# Patient Record
Sex: Female | Born: 1965 | Race: White | Hispanic: No | Smoking: Former smoker
Health system: Southern US, Community
[De-identification: ages and names within clinical notes are randomized; demographics above are authoritative.]

## PROBLEM LIST (undated history)

## (undated) HISTORY — PX: PARTIAL HYSTERECTOMY: SHX80

## (undated) HISTORY — PX: CARPAL TUNNEL RELEASE: SHX101

## (undated) HISTORY — PX: HERNIA REPAIR: SHX51

## (undated) HISTORY — PX: TONSILLECTOMY: SUR1361

## (undated) HISTORY — PX: CHOLECYSTECTOMY: SHX55

---

## 1999-05-22 ENCOUNTER — Other Ambulatory Visit: Admission: RE | Admit: 1999-05-22 | Discharge: 1999-05-22 | Payer: Self-pay | Admitting: Family Medicine

## 2001-07-27 ENCOUNTER — Other Ambulatory Visit: Admission: RE | Admit: 2001-07-27 | Discharge: 2001-07-27 | Payer: Self-pay | Admitting: Family Medicine

## 2001-08-23 ENCOUNTER — Ambulatory Visit (HOSPITAL_COMMUNITY): Admission: RE | Admit: 2001-08-23 | Discharge: 2001-08-23 | Payer: Self-pay | Admitting: Unknown Physician Specialty

## 2001-08-23 ENCOUNTER — Encounter: Payer: Self-pay | Admitting: Unknown Physician Specialty

## 2002-09-26 ENCOUNTER — Other Ambulatory Visit: Admission: RE | Admit: 2002-09-26 | Discharge: 2002-09-26 | Payer: Self-pay | Admitting: Family Medicine

## 2003-03-21 ENCOUNTER — Observation Stay (HOSPITAL_COMMUNITY): Admission: AD | Admit: 2003-03-21 | Discharge: 2003-03-22 | Payer: Self-pay | Admitting: Obstetrics and Gynecology

## 2003-03-21 ENCOUNTER — Encounter (INDEPENDENT_AMBULATORY_CARE_PROVIDER_SITE_OTHER): Payer: Self-pay

## 2004-06-09 ENCOUNTER — Ambulatory Visit: Payer: Self-pay | Admitting: Family Medicine

## 2004-08-10 ENCOUNTER — Ambulatory Visit: Payer: Self-pay | Admitting: Family Medicine

## 2007-07-05 ENCOUNTER — Encounter: Admission: RE | Admit: 2007-07-05 | Discharge: 2007-07-05 | Payer: Self-pay | Admitting: Family Medicine

## 2010-12-04 NOTE — Op Note (Signed)
NAME:  Denise Black, Denise Black                           ACCOUNT NO.:  [id number]   MEDICAL RECORD NO.:  [id number]                   PATIENT TYPE:  OBV   LOCATION:  9309                                 FACILITY:  [facility]   PHYSICIAN:  Dineen Kid. Rana Snare, M.D.                 DATE OF BIRTH:  23-Jul-1965   DATE OF PROCEDURE:  03/21/2003  DATE OF DISCHARGE:                                 OPERATIVE REPORT   PREOPERATIVE DIAGNOSES:  1. Menometrorrhagia.  2. Dysmenorrhea.  3. Pelvic pain.  4. Dyspareunia.   POSTOPERATIVE DIAGNOSES:  1. Menometrorrhagia.  2. Dysmenorrhea.  3. Pelvic pain.  4. Dyspareunia.  5. Pelvic adhesions.   PROCEDURE:  Laparoscopically-assisted vaginal hysterectomy and lysis of  adhesions.   SURGEON:  Dineen Kid. Rana Snare, M.D.   ASSISTANT:  Raynald Kemp, M.D.   ANESTHESIA:  General endotracheal.   INDICATIONS:  Denise Black is a 45 year old G2, P2, referred for evaluation of  abnormal uterine bleeding over the last several years.  She has been on  dozens of different types of birth control pills and has continued to bleed  through these.  She also has dyspareunia and dysmenorrhea, which  have not  been responsive to anti-inflammatory medications.  Currently taking Vicodin  for pain.  She underwent a sonohysterogram, which shows a thickened  myometrium throughout the uterus, which is suspicious for adenomyosis.  No  intracavitary masses are noted on sonohysterogram.  She desires definitive  surgical treatment and requests surgical treatment and requests a  hysterectomy and presents for laparoscopically-assisted vaginal  hysterectomy.  Risks and benefits were discussed at length, and informed  consent was obtained.  See history and physical for further details.   FINDINGS AT TIME OF SURGERY:  A normal-appearing uterus, slightly boggy in  appearance.  Normal-appearing ovaries, cul-de-sac, bladder.  There were  adhesions from the omentum to the bowel and to the anterior abdominal  wall  from previous gallbladder surgery in the right lower quadrant.  Otherwise  normal-appearing liver.   DESCRIPTION OF PROCEDURE:  After adequate analgesia, the patient was placed  in the dorsal lithotomy position.  She was sterilely prepped and draped.  The bladder was sterilely drained.  A Graves speculum was placed and a Hulka  tenaculum was placed in the anterior lip of the cervix.  A 1 cm  infraumbilical skin incision was made, a Veress needle was insufflated, the  abdomen was insufflated to dullness with percussion.  An 11 mm trocar was  inserted and the laparoscope was inserted, and the above findings were  noted.  A 5 mm trocar was inserted to the left of midline two fingerbreadths  above the pubic symphysis.  The Gyrus tripolar ligature was used to  cauterize and ligate the omentum and bowel adhered to the anterior abdominal  wall, care taken to avoid the lumen of the bowel and achieve good  hemostasis.  The left utero-ovarian ligament was identified and ligated and  dissected across the round ligament through the utero-ovarian ligaments,  down across the remnant.  This was done similarly on the right side, across  the fallopian tube, utero-ovarian ligament, down across the round ligament.  The bladder was elevated, a small window in the vesicouterine fold was made.  The abdomen was then desufflated, the legs repositioned.  A weighted  speculum is placed, Jacobs tenaculum placed on the posterior cervix.  A  posterior colpotomy was performed.  The cervix was circumscribed with Bovie  cautery.  A LigaSure instrument was used to clamp and ligate both  uterosacral ligaments, dissected with Mayo scissors, ligated across the  cardinal ligaments and the bladder pillars in similar fashion.  The bladder  was dissected off the anterior surface of the cervix.  Anterior peritoneum  was entered and placed behind the Deaver retractor.  Successive bites across  the inferior portion of the  broad ligament were performed with the LigaSure  with good hemostasis achieved.  The uterus was then delivered into the  introitus and removed.  The uterosacral ligaments were then ligated with  sutures of 0 Monocryl in a figure-of-eight fashion.  The posterior  peritoneum was closed in a pursestring fashion and the posterior vaginal  mucosa was closed with figure-of-eights of 0 Monocryl in a vertical fashion.  The anterior vaginal mucosa was closed in a similar fashion using figure-of-  eights of 0 Monocryl with good approximation and good hemostasis achieved.  A Foley catheter was placed with return of clear yellow urine.  The abdomen  was reinsufflated.  The legs were repositioned, and re-examination of the  pelvis and cul-de-sac and area from the lysis of adhesions revealed good  hemostasis with the exception of a couple of small peritoneal edges, which  were cauterized with the Gyrus ligator.  After hemostasis had been assured,  the abdomen was desufflated, the trocar removed.  The infraumbilical skin  incision was closed with a 0 Vicryl in the fascia, interrupted suture, and a  3-0 Vicryl Rapide subcuticular stitch.  The 5 mm site was closed with a 3-0  Vicryl Rapide subcuticular stitch.  The incisions were injected with 0.25%  Marcaine.  The patient tolerated the procedure well, was taken to the  recovery room in satisfactory condition.  Sponge and instrument count was  normal x3.  Estimated blood loss from the procedure was 200 mL.                                               Dineen Kid Rana Snare, M.D.    DCL/MEDQ  D:  03/21/2003  T:  03/21/2003  Job:  [id number]

## 2010-12-04 NOTE — Discharge Summary (Signed)
   NAME:  Denise Black, HAUSER                           ACCOUNT NO.:  [id number]   MEDICAL RECORD NO.:  [id number]                   PATIENT TYPE:  OBV   LOCATION:  9309                                 FACILITY:  [facility]   PHYSICIAN:  Dineen Kid. Rana Snare, M.D.                 DATE OF BIRTH:  1965/09/01   DATE OF ADMISSION:  03/21/2003  DATE OF DISCHARGE:  03/22/2003                                 DISCHARGE SUMMARY   HISTORY OF PRESENT ILLNESS:  Ms. Shugrue is a 45 year old, G2, P2, referred  for evaluation of abnormal bleeding.  Over the last several years, has been  on and off dosing of  birth control pills without success.  She is also  complaining of dyspareunia, dysmenorrhea which has not been responsive to  conservative management which has included oral contraceptive agents,  antiinflammatory medications and narcotics.  She presents today for  definitive surgical treatment of planned laparoscopically assisted vaginal  hysterectomy.   HOSPITAL COURSE:  The patient was admitted to same day of surgery, underwent  a laparoscopically assisted vaginal hysterectomy and lysis of adhesions.  The surgery was uncomplicated with an estimated blood loss of 200 mL.  The  findings at the time of surgery were omental adhesions in the right lower  quadrant from a previous hernia repair, normal appearing liver and ovaries.  Uterus was slightly boggy consistent with adenomyosis.  Her postoperative  course was unremarkable with good return of bowel function and ambulation.   By postoperative day #1, she was tolerating a regular diet, ambulating  without difficulty.  Postoperative hemoglobin 11.0.  Abdomen was soft and  nontender with normoactive bowel sounds.  The incisions were clean, dry and  intact.  The patient was discharged home.   DISPOSITION:  The patient will be discharged home with followup in the  office in 1-2 weeks.  She was sent home with a prescription for Dilaudid.  Told to return for increased  pain, fever or bleeding.                                               Dineen Kid Rana Snare, M.D.    DCL/MEDQ  D:  03/22/2003  T:  03/23/2003  Job:  [id number]

## 2010-12-04 NOTE — H&P (Signed)
NAME:  Denise Black, Denise Black                           ACCOUNT NO.:  [id number]   MEDICAL RECORD NO.:  [id number]                   PATIENT TYPE:  OBV   LOCATION:  NA                                   FACILITY:  [facility]   PHYSICIAN:  Dineen Kid. Rana Snare, M.D.                 DATE OF BIRTH:  1966/06/21   DATE OF ADMISSION:  DATE OF DISCHARGE:                                HISTORY & PHYSICAL   SCHEDULED DATE OF ADMISSION:  March 21, 2003   HISTORY OF PRESENT ILLNESS:  Ms. Carby is a 45 year old G2 P2 who has been  referred to me for evaluation of abnormal bleeding over the last several  years.  She has been dozens of different birth control pills, continues to  bleed through these.  She also has dyspareunia and dysmenorrhea which has  not been responsive to antiinflammatory medications.  Currently taking  Vicodin for this pain.  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  She underwent a  sonohysterogram which shows a thickened myometrium throughout the uterus  which is suspicious for adenomyosis.  She does have no intracavitary masses  noted on sonohysterogram and normal-appearing ovaries.  She desires  definitive surgical treatment and requests a hysterectomy, and presents for  a laparoscopy-assisted vaginal hysterectomy.   PAST MEDICAL HISTORY:  Significant for reflex dystrophy.   PAST SURGICAL HISTORY:  She has had her gallbladder removed in May 2004.   PAST OBSTETRICAL HISTORY:  She has had two vaginal deliveries.   ALLERGIES:  PENICILLIN.   MEDICATIONS:  She currently takes Flexeril and Vicodin.   PHYSICAL EXAMINATION:  VITAL SIGNS:  Blood pressure is 118/70.  HEART:  Regular rate and rhythm.  LUNGS:  Clear to auscultation bilaterally.  ABDOMEN:  Nondistended, nontender.  No masses are palpable.  PELVIC:  External genitalia has a maroon-colored plaque over the labia  majora which appears to be resolving dermatophyte infection.   No  lymphadenopathy is noted.  Cervix within normal limits.  The uterus is  anteverted, mobile, nontender.  No adnexal masses are palpable.   IMPRESSION AND PLAN:  Menometrorrhagia, dysmenorrhea, pelvic pain, and  dyspareunia.  Ultrasound and sonohysterogram consistent with adenomyosis  with no intracavitary masses.  The patient was offered different expectant  management such as continue antiinflammatory medications, different oral  contraceptive agents - pills or Depo-Provera.  She has tried all of these in  the past with very little success.  She desires more definitive surgery such  as hysterectomy and has no further childbearing desires.   PLAN:  Proceed with laparoscopy-assisted vaginal hysterectomy with  preservation of both ovaries.  Risks and benefits of the procedure were  discussed at length which include but not limited to:  Risk of infection,  bleeding;  damage to ureters, tubes, ovaries, bowel, bladder; risks associated with  blood  loss, blood transfusion risks, and risks associated with anesthesia;  also the possibility that this may not alleviate the pelvic pain or possibly  worsen the pelvic pain.  She does give her informed consent and wishes to  proceed.                                               Dineen Kid Rana Snare, M.D.    DCL/MEDQ  D:  03/19/2003  T:  03/19/2003  Job:  [id number]

## 2010-12-24 ENCOUNTER — Emergency Department (HOSPITAL_COMMUNITY): Payer: No Typology Code available for payment source

## 2010-12-24 ENCOUNTER — Observation Stay (HOSPITAL_COMMUNITY)
Admission: EM | Admit: 2010-12-24 | Discharge: 2010-12-25 | DRG: 512 | Disposition: A | Discharge status: Home / Self Care | Payer: No Typology Code available for payment source | Attending: Orthopedic Surgery | Admitting: Orthopedic Surgery

## 2010-12-24 DIAGNOSIS — G5600 Carpal tunnel syndrome, unspecified upper limb: Secondary | ICD-10-CM | POA: Insufficient documentation

## 2010-12-24 DIAGNOSIS — Z01818 Encounter for other preprocedural examination: Secondary | ICD-10-CM | POA: Insufficient documentation

## 2010-12-24 DIAGNOSIS — Z01810 Encounter for preprocedural cardiovascular examination: Secondary | ICD-10-CM | POA: Insufficient documentation

## 2010-12-24 DIAGNOSIS — S4490XA Injury of unspecified nerve at shoulder and upper arm level, unspecified arm, initial encounter: Secondary | ICD-10-CM | POA: Insufficient documentation

## 2010-12-24 DIAGNOSIS — S52599A Other fractures of lower end of unspecified radius, initial encounter for closed fracture: Principal | ICD-10-CM | POA: Insufficient documentation

## 2010-12-24 DIAGNOSIS — Z01812 Encounter for preprocedural laboratory examination: Secondary | ICD-10-CM | POA: Insufficient documentation

## 2010-12-24 LAB — CBC
Platelets: 290 10*3/uL (ref 150–400)
RBC: 4 MIL/uL (ref 3.87–5.11)
WBC: 10.4 10*3/uL (ref 4.0–10.5)

## 2010-12-24 LAB — COMPREHENSIVE METABOLIC PANEL
AST: 16 U/L (ref 0–37)
Albumin: 3.4 g/dL — ABNORMAL LOW (ref 3.5–5.2)
Alkaline Phosphatase: 59 U/L (ref 39–117)
Chloride: 105 mEq/L (ref 96–112)
Creatinine, Ser: 0.61 mg/dL (ref 0.4–1.2)
GFR calc Af Amer: >60 mL/min (ref >60)
Potassium: 3.6 mEq/L (ref 3.5–5.1)
Total Bilirubin: 0.1 mg/dL — ABNORMAL LOW (ref 0.3–1.2)
Total Protein: 6.9 g/dL (ref 6.0–8.3)

## 2010-12-24 LAB — DIFFERENTIAL
Basophils Relative: 0 % (ref 0–1)
Eosinophils Absolute: 0.1 10*3/uL (ref 0.0–0.7)
Neutrophils Relative %: 64 % (ref 43–77)

## 2011-01-04 NOTE — Op Note (Signed)
[name], [name] NO.:  [id number]  MEDICAL RECORD NO.:  [id number]  LOCATION:  5016                         FACILITY:  [facility]  PHYSICIAN:  Dionne Ano. Ryane Canavan, M.D.DATE OF BIRTH:  [birth year]  DATE OF PROCEDURE: DATE OF DISCHARGE:  12/25/2010                              OPERATIVE REPORT   PREOPERATIVE DIAGNOSES:  Left comminuted complex greater than 5 part distal radius fracture with displacement and history of carpal tunnel syndrome with intermittent neurapraxia.  POSTOPERATIVE DIAGNOSES:  Left comminuted complex greater than 5 part distal radius fracture with displacement and history of carpal tunnel syndrome with intermittent neurapraxia.  PROCEDURE: 1. Open reduction and internal fixation with Synthes distal radius     plate, left distal radius fracture which was comminuted complex     greater than 5 part. 2. Left carpal tunnel release. 3. Stress radiography.  SURGEON:  Dionne Ano. Amanda Pea, MD  ASSISTANT:  Karie Chimera, PA-C  COMPLICATIONS:  None.  ANESTHESIA:  General with preoperative block given her history of complex regional pain symptoms in the past.  ESTIMATED BLOOD LOSS:  Minimal.  DRAINS:  One.  INDICATIONS FOR PROCEDURE:  A 45 year old female presents with the above- mentioned diagnosis.  This patient has a comminuted complex fracture with displacement history of carpal tunnel syndrome and history of complex regional pain syndrome.  We have recommended ORIF.  She understands risks and benefits including resurgence and recurrence of her dystrophy, bleeding, infection, anesthetic risk, damage to normal structures and failure of surgery to accomplish its intended goals of relieving symptoms and restoring function.  With this in mind, she desires to proceed.  All questions have been encouraged and answered preoperatively.  We specifically asked for block.  We are going to make sure that she does not have dystrophic symptoms and get  her out of __________ early in terms of motion and therapy.  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  I have discussed with the patient that certainly given the volar displacement, she is not a candidate for closed treatment.  She has a volar Barton's fracture pattern with associated ulnar styloid fracture.  Given the history of carpal tunnel syndrome and the issues of possible neural dystrophic focus and other issues, we have elected to proceed with carpal tunnel release as well.  OPERATIVE PROCEDURE:  The patient was seen by myself and anesthesia, taken to operative suite, underwent smooth induction of general anesthesia.  Preoperatively, a block was placed at our request.  Time- out was called, consent verified, and all questions were encouraged and answered including the risk benefit profile.  Once in the operative room, she was prepped and draped in usual sterile fashion with Betadine scrub and paint about the left upper extremity.  Once this was done, volar radial incision was made with anterior 50-mm tourniquet control. FCR tendon sheath was incised palmarly and dorsally.  Radial artery was protected.  Carpal canal contents were swept ulnarly and following this the patient had access to the pronator.  The pronator  was incised. Following this fracture was accessed.  Once this done, Synthes distal radius plate was contoured, prebent, and placed under standard AO technique.  I was able to achieve adequate position.  I was able to recreate the volar height and inclination, radial height and radial inclination.  I was pleased with the findings.  The patient reduced nicely.  Following this, I then performed radial styloid ORIF with combination screws and 2 pins which were buried.  Pins will be removed at a later date once  fracture healing is stabilized.  Once the screw pin and plate construct was placed, live fluoro was performed and she had excellent stability noted.  At this time, we closed the pronator with Vicryl, placed a TLS drain and then made a 1-1/2 to 2 cm incision at the distal edge of transcarpal ligament.  Dissection was carried down and the patient then underwent release of the palmar fascia followed by release of the transverse carpal ligament throughout its extent.  Fat pad egressed nicely distally.  Distal proximal dissection was carried out so that we could properly release the proximal leaflet which was done without complicating feature.  Median nerve sat tension free.  It was hyperemic, intact, and without iatrogenic injury.  Thus carpal tunnel release and ORIF of the distal radius was accomplished.  The distal radioulnar joint was stable.  There was no locking, popping, catching, and all looked quite well.  She was irrigated copiously and closed with Prolene in the skin edge about the carpal tunnel release site.  Vicryl followed by Prolene in the skin edge about the volar radial incision for the radius fracture was used.  The patient tolerated this all quite well without difficulty.  Thumb spica clamshell splint was placed without difficulty making sure the finger showed excellent refill and the compartments were soft.  She was transferred to the recovery room in stable condition.  We will plan for pain management, ice, elevation range of motion, antibiotic per protocol.  We are going to monitor condition closely while she is in the hospital.  She will be admitted for IV antibiotics in the overnight stay of course. Of course with her history of complex regional pain syndrome, I want to keep an extra special eye on her to make sure there are no complicating issues that arise.  These notes have been discussed and all questions encouraged and answered.     Dionne Ano. Amanda Pea,  M.D.     [facility]  D:  12/25/2010  T:  12/25/2010  Job:  [id number]  Electronically Signed by Dominica Severin M.D. on 01/04/2011 06:33:40 AM

## 2011-02-10 ENCOUNTER — Ambulatory Visit: Payer: No Typology Code available for payment source | Attending: Orthopedic Surgery | Admitting: Occupational Therapy

## 2011-02-10 DIAGNOSIS — M25549 Pain in joints of unspecified hand: Secondary | ICD-10-CM | POA: Insufficient documentation

## 2011-02-10 DIAGNOSIS — M6281 Muscle weakness (generalized): Secondary | ICD-10-CM | POA: Insufficient documentation

## 2011-02-10 DIAGNOSIS — IMO0001 Reserved for inherently not codable concepts without codable children: Secondary | ICD-10-CM | POA: Insufficient documentation

## 2011-02-10 DIAGNOSIS — M25649 Stiffness of unspecified hand, not elsewhere classified: Secondary | ICD-10-CM | POA: Insufficient documentation

## 2011-02-15 ENCOUNTER — Ambulatory Visit: Payer: No Typology Code available for payment source | Admitting: Occupational Therapy

## 2011-02-17 ENCOUNTER — Ambulatory Visit: Payer: No Typology Code available for payment source | Attending: Orthopedic Surgery | Admitting: Occupational Therapy

## 2011-02-17 DIAGNOSIS — M25649 Stiffness of unspecified hand, not elsewhere classified: Secondary | ICD-10-CM | POA: Insufficient documentation

## 2011-02-17 DIAGNOSIS — M25549 Pain in joints of unspecified hand: Secondary | ICD-10-CM | POA: Insufficient documentation

## 2011-02-17 DIAGNOSIS — M6281 Muscle weakness (generalized): Secondary | ICD-10-CM | POA: Insufficient documentation

## 2011-02-17 DIAGNOSIS — IMO0001 Reserved for inherently not codable concepts without codable children: Secondary | ICD-10-CM | POA: Insufficient documentation

## 2011-02-23 ENCOUNTER — Ambulatory Visit: Payer: No Typology Code available for payment source | Admitting: Occupational Therapy

## 2011-02-26 ENCOUNTER — Ambulatory Visit: Payer: No Typology Code available for payment source | Admitting: Occupational Therapy

## 2011-03-02 ENCOUNTER — Ambulatory Visit: Payer: No Typology Code available for payment source | Admitting: Occupational Therapy

## 2011-03-04 ENCOUNTER — Encounter: Payer: No Typology Code available for payment source | Admitting: Occupational Therapy

## 2011-03-09 ENCOUNTER — Ambulatory Visit: Payer: No Typology Code available for payment source | Admitting: Occupational Therapy

## 2011-03-11 ENCOUNTER — Ambulatory Visit: Payer: No Typology Code available for payment source | Admitting: Occupational Therapy

## 2011-03-16 ENCOUNTER — Encounter: Payer: No Typology Code available for payment source | Admitting: Occupational Therapy

## 2011-03-19 ENCOUNTER — Ambulatory Visit: Payer: No Typology Code available for payment source | Admitting: Occupational Therapy

## 2011-03-23 ENCOUNTER — Ambulatory Visit: Payer: No Typology Code available for payment source | Attending: Orthopedic Surgery | Admitting: Occupational Therapy

## 2011-03-23 DIAGNOSIS — M25649 Stiffness of unspecified hand, not elsewhere classified: Secondary | ICD-10-CM | POA: Insufficient documentation

## 2011-03-23 DIAGNOSIS — IMO0001 Reserved for inherently not codable concepts without codable children: Secondary | ICD-10-CM | POA: Insufficient documentation

## 2011-03-23 DIAGNOSIS — M25549 Pain in joints of unspecified hand: Secondary | ICD-10-CM | POA: Insufficient documentation

## 2011-03-23 DIAGNOSIS — M6281 Muscle weakness (generalized): Secondary | ICD-10-CM | POA: Insufficient documentation

## 2011-03-25 ENCOUNTER — Ambulatory Visit: Payer: No Typology Code available for payment source | Admitting: Occupational Therapy

## 2011-03-30 ENCOUNTER — Ambulatory Visit: Payer: No Typology Code available for payment source | Admitting: Occupational Therapy

## 2011-04-01 ENCOUNTER — Ambulatory Visit: Payer: No Typology Code available for payment source | Admitting: Occupational Therapy

## 2011-04-06 ENCOUNTER — Ambulatory Visit: Payer: No Typology Code available for payment source | Admitting: Occupational Therapy

## 2011-04-09 ENCOUNTER — Ambulatory Visit: Payer: No Typology Code available for payment source | Admitting: Occupational Therapy

## 2011-04-13 ENCOUNTER — Ambulatory Visit: Payer: No Typology Code available for payment source | Admitting: Occupational Therapy

## 2011-12-23 ENCOUNTER — Other Ambulatory Visit: Payer: Self-pay | Admitting: Obstetrics

## 2011-12-23 DIAGNOSIS — Z1231 Encounter for screening mammogram for malignant neoplasm of breast: Secondary | ICD-10-CM

## 2012-01-07 ENCOUNTER — Ambulatory Visit: Payer: No Typology Code available for payment source

## 2012-01-10 ENCOUNTER — Ambulatory Visit
Admission: RE | Admit: 2012-01-10 | Discharge: 2012-01-10 | Disposition: A | Discharge status: Home / Self Care | Payer: No Typology Code available for payment source | Source: Ambulatory Visit | Attending: Obstetrics | Admitting: Obstetrics

## 2012-01-10 DIAGNOSIS — Z1231 Encounter for screening mammogram for malignant neoplasm of breast: Secondary | ICD-10-CM

## 2012-11-01 IMAGING — MG MM DIGITAL SCREENING BILAT
4 series · 4 of 4 positions shown · non-contrast
Comparison: Previous exams

CLINICAL DATA: Screening.

DIGITAL SCREENING MAMMOGRAM WITH CAD

[R CC]
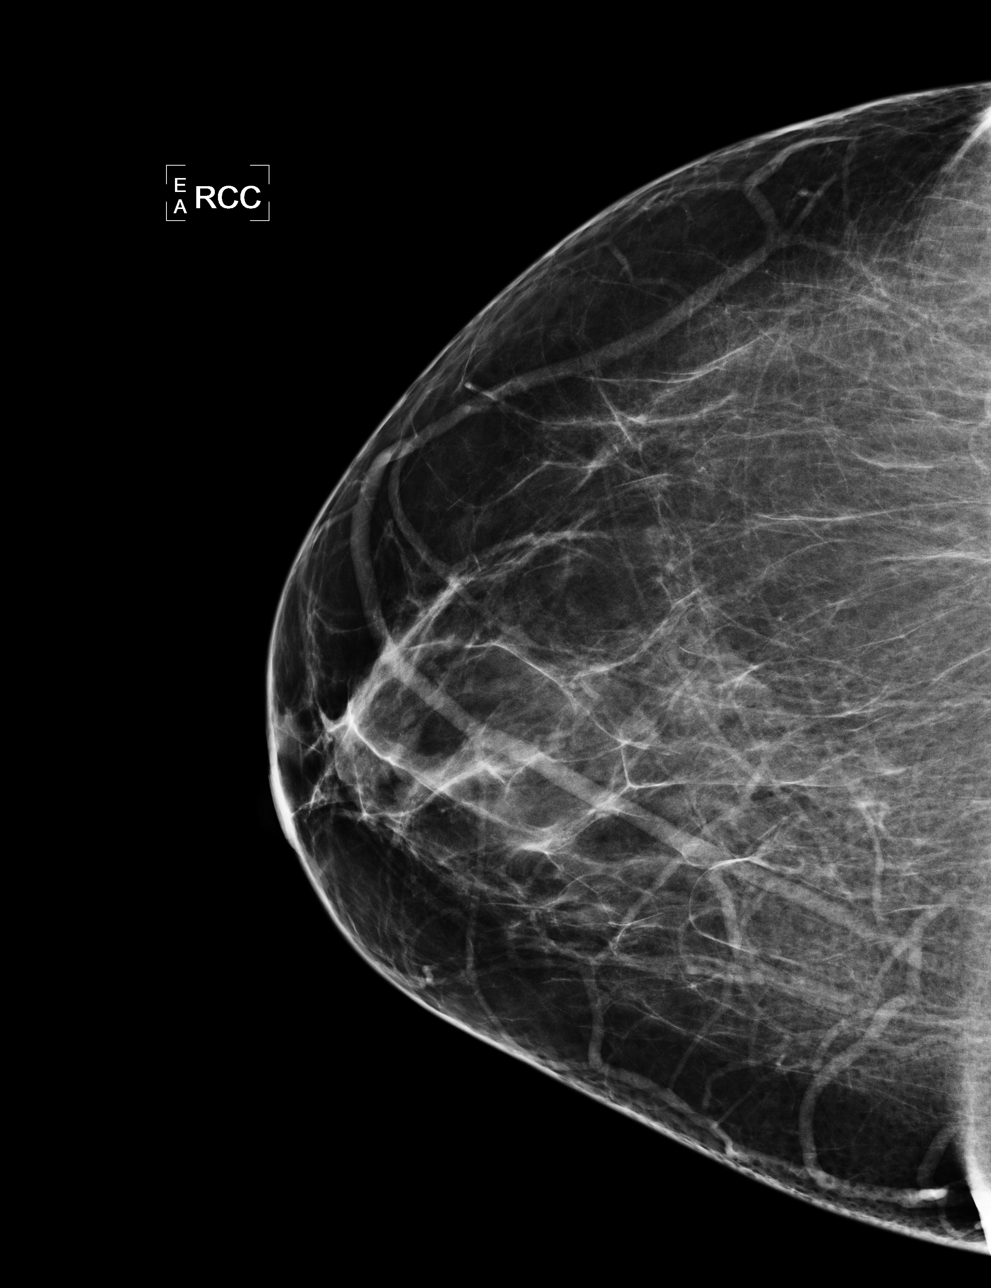

[L CC]
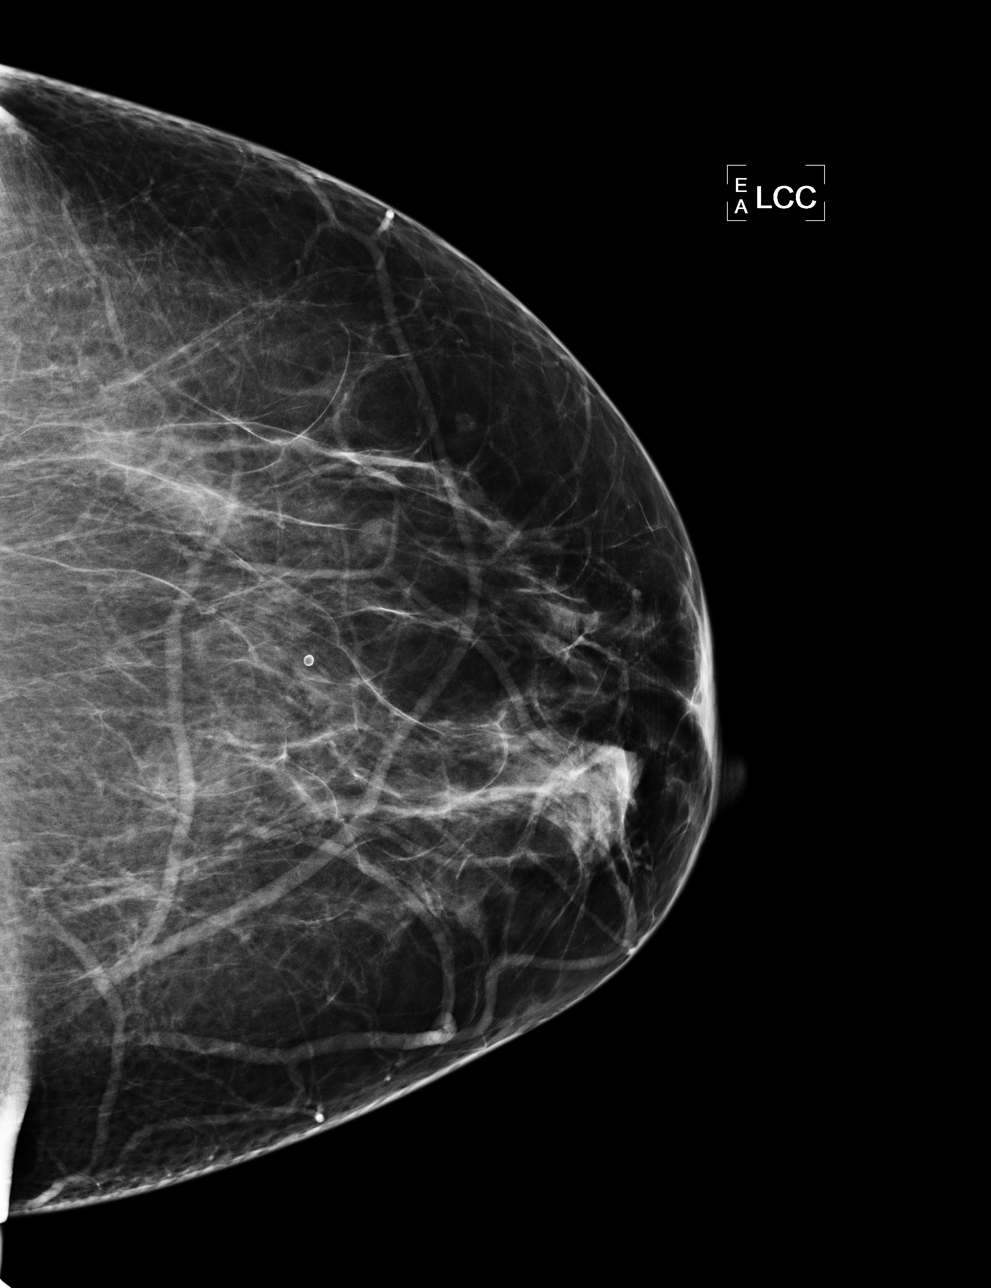

[L MLO]
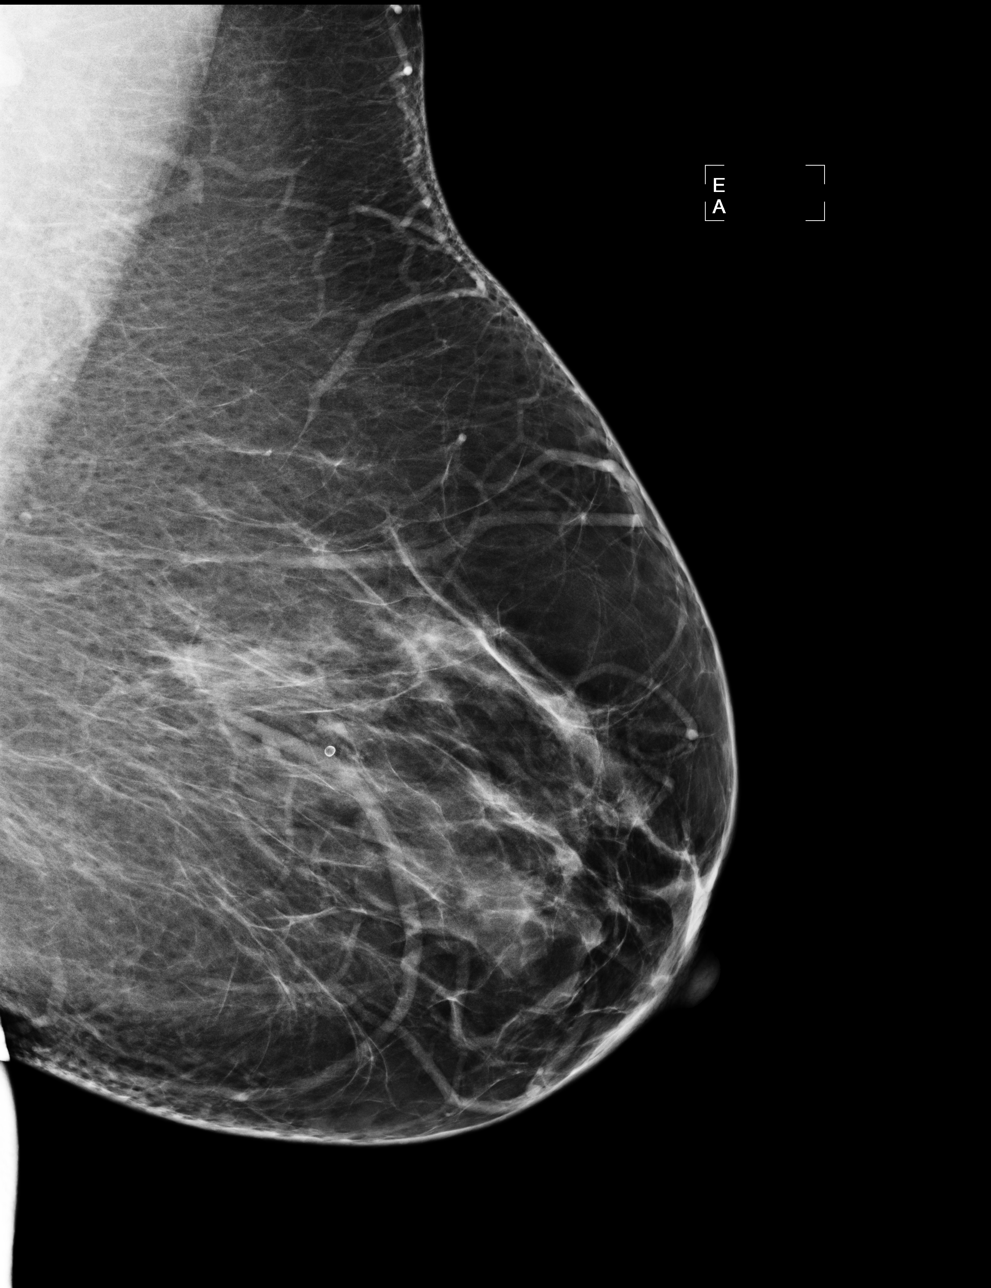

[R MLO]
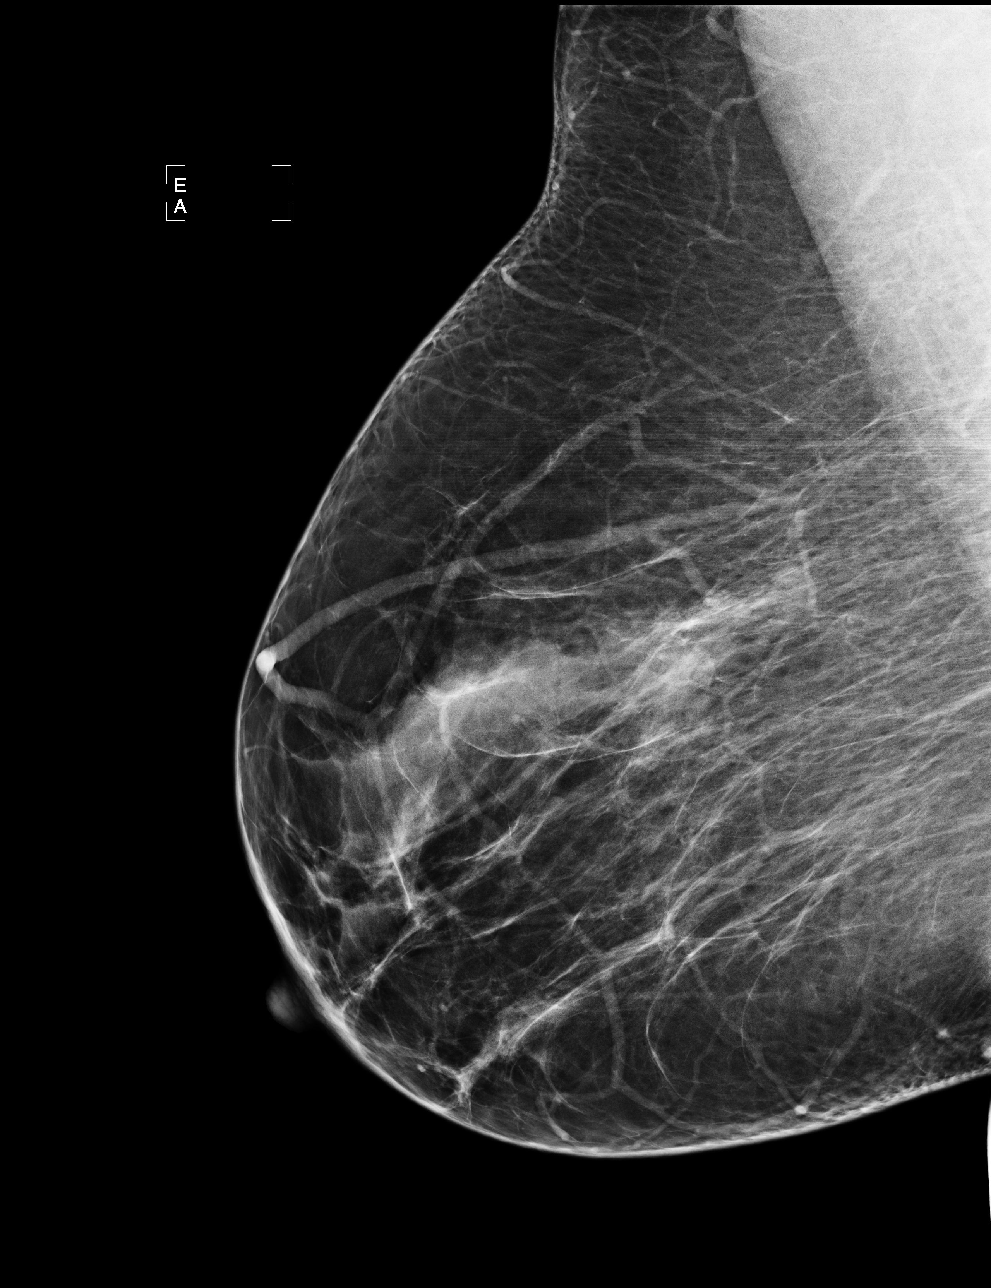

[4 of 4 positions shown; findings below may reference images not displayed]

FINDINGS: There are scattered fibroglandular densities. No
suspicious masses, architectural distortion, or calcifications are
present.

Images were processed with CAD.
IMPRESSION: No specific mammographic evidence of malignancy.

A result letter of this screening mammogram will be mailed directly
to the patient.

RECOMMENDATION:
Screening mammogram in one year. (Code:A7-G-7OZ)

BI-RADS CATEGORY 1:  Negative

## 2013-03-13 DIAGNOSIS — M533 Sacrococcygeal disorders, not elsewhere classified: Secondary | ICD-10-CM | POA: Insufficient documentation

## 2013-03-13 DIAGNOSIS — G542 Cervical root disorders, not elsewhere classified: Secondary | ICD-10-CM | POA: Insufficient documentation

## 2013-03-13 DIAGNOSIS — M545 Low back pain, unspecified: Secondary | ICD-10-CM | POA: Insufficient documentation

## 2013-03-13 HISTORY — DX: Sacrococcygeal disorders, not elsewhere classified: M53.3

## 2013-06-05 DIAGNOSIS — IMO0001 Reserved for inherently not codable concepts without codable children: Secondary | ICD-10-CM

## 2013-06-05 HISTORY — DX: Reserved for inherently not codable concepts without codable children: IMO0001

## 2013-08-23 DIAGNOSIS — M129 Arthropathy, unspecified: Secondary | ICD-10-CM | POA: Insufficient documentation

## 2013-11-13 DIAGNOSIS — M5416 Radiculopathy, lumbar region: Secondary | ICD-10-CM | POA: Insufficient documentation

## 2013-11-13 DIAGNOSIS — M25559 Pain in unspecified hip: Secondary | ICD-10-CM | POA: Insufficient documentation

## 2013-11-13 DIAGNOSIS — G2581 Restless legs syndrome: Secondary | ICD-10-CM

## 2013-11-13 DIAGNOSIS — M51379 Other intervertebral disc degeneration, lumbosacral region without mention of lumbar back pain or lower extremity pain: Secondary | ICD-10-CM

## 2013-11-13 DIAGNOSIS — M5137 Other intervertebral disc degeneration, lumbosacral region: Secondary | ICD-10-CM

## 2013-11-13 HISTORY — DX: Restless legs syndrome: G25.81

## 2013-11-13 HISTORY — DX: Other intervertebral disc degeneration, lumbosacral region: M51.37

## 2013-11-13 HISTORY — DX: Other intervertebral disc degeneration, lumbosacral region without mention of lumbar back pain or lower extremity pain: M51.379

## 2013-11-16 DIAGNOSIS — M25569 Pain in unspecified knee: Secondary | ICD-10-CM | POA: Insufficient documentation

## 2017-08-23 ENCOUNTER — Ambulatory Visit: Payer: 59 | Admitting: Podiatry

## 2017-08-23 ENCOUNTER — Encounter: Payer: Self-pay | Admitting: Podiatry

## 2017-08-23 ENCOUNTER — Ambulatory Visit (INDEPENDENT_AMBULATORY_CARE_PROVIDER_SITE_OTHER): Payer: 59

## 2017-08-23 VITALS — BP 105/67 | HR 84 | Ht 62.0 in | Wt 210.0 lb

## 2017-08-23 DIAGNOSIS — M659 Unspecified synovitis and tenosynovitis, unspecified site: Secondary | ICD-10-CM

## 2017-08-23 DIAGNOSIS — M722 Plantar fascial fibromatosis: Secondary | ICD-10-CM | POA: Diagnosis not present

## 2017-08-23 DIAGNOSIS — M79672 Pain in left foot: Secondary | ICD-10-CM

## 2017-08-23 DIAGNOSIS — M7662 Achilles tendinitis, left leg: Secondary | ICD-10-CM

## 2017-08-23 DIAGNOSIS — M216X9 Other acquired deformities of unspecified foot: Secondary | ICD-10-CM | POA: Diagnosis not present

## 2017-08-23 MED ORDER — MELOXICAM 15 MG PO TABS: 15.0000 mg | ORAL_TABLET | Freq: Every day | ORAL | 0 refills | Status: DC

## 2017-08-23 NOTE — Progress Notes (Signed)
  Subjective:  Patient ID: Denise Black, female    DOB: 1966-06-19,  MRN: [id number]  Chief Complaint  Patient presents with  . Foot Pain    left heel pain am tender pm extremely painful    52 y.o. female presents for heel pain. Reports severe pain to the left heel.  Worsening over the past 3 weeks.  Pain is worse at the end of the day.  Denies pain worse in the morning.  States that the right foot started to hurt as well.  No past medical history on file.   Current Outpatient Medications:  .  esomeprazole (NEXIUM) 20 MG capsule, Take by mouth., Disp: , Rfl:  .  meloxicam (MOBIC) 15 MG tablet, Take 1 tablet (15 mg total) by mouth daily., Disp: 30 tablet, Rfl: 0  Allergies  Allergen Reactions  . Iodinated Diagnostic Agents   . Penicillins Rash    ROS: All systems reviewed and negative except as noted in the HPI. Objective:   Vitals:   08/23/17 1440  BP: 105/67  Pulse: 84   General AA&O x3. Normal mood and affect.  Vascular Dorsalis pedis and posterior tibial pulses  present 2+ bilaterally  Capillary refill normal to all digits. Pedal hair growth normal.  Neurologic Epicritic sensation grossly present bilaterally.  Dermatologic No open lesions. Interspaces clear of maceration. Nails well groomed and normal in appearance.  Orthopedic: MMT 5/5 in dorsiflexion, plantarflexion, inversion, and eversion bilaterally. Tender to palpation at the calcaneal tuber left. No pain with calcaneal squeeze bilaterally. Ankle ROM diminished range of motion bilaterally. Silfverskiold Test: positive bilaterally.   Radiographs: Taken and reviewed. No acute fractures. No evidence of calcaneal stress fracture. Posterior and retrocalcaneal spurring noted.   Assessment & Plan:  Patient was evaluated and treated and all questions answered.  Plantar Fasciitis, left; Achilles Tendonitis, L - XR reviewed as above.  - Educated on icing and stretching. Instructions given.  - Injection delivered to  the plantar fascia as below. - Night splint dispensed. - Rx Meloxicam  Procedure: Injection Tendon/Ligament Location: Left plantar fascia at the glabrous junction; medial approach. Skin Prep: Alcohol. Injectate: 1 cc 0.5% marcaine plain, 1 cc dexamethasone phosphate, 0.5 cc kenalog 10. Disposition: Patient tolerated procedure well. Injection site dressed with a band-aid.  Return in about 3 weeks (around 09/13/2017) for Plantar fasciitis.

## 2017-08-23 NOTE — Patient Instructions (Signed)
Plantar Fasciitis (Heel Spur Syndrome) with Rehab The plantar fascia is a fibrous, ligament-like, soft-tissue structure that spans the bottom of the foot. Plantar fasciitis is a condition that causes pain in the foot due to inflammation of the tissue. SYMPTOMS   Pain and tenderness on the underneath side of the foot.  Pain that worsens with standing or walking. CAUSES  Plantar fasciitis is caused by irritation and injury to the plantar fascia on the underneath side of the foot. Common mechanisms of injury include:  Direct trauma to bottom of the foot.  Damage to a small nerve that runs under the foot where the main fascia attaches to the heel bone.  Stress placed on the plantar fascia due to bone spurs. RISK INCREASES WITH:   Activities that place stress on the plantar fascia (running, jumping, pivoting, or cutting).  Poor strength and flexibility.  Improperly fitted shoes.  Tight calf muscles.  Flat feet.  Failure to warm-up properly before activity.  Obesity. PREVENTION  Warm up and stretch properly before activity.  Allow for adequate recovery between workouts.  Maintain physical fitness:  Strength, flexibility, and endurance.  Cardiovascular fitness.  Maintain a health body weight.  Avoid stress on the plantar fascia.  Wear properly fitted shoes, including arch supports for individuals who have flat feet.  PROGNOSIS  If treated properly, then the symptoms of plantar fasciitis usually resolve without surgery. However, occasionally surgery is necessary.  RELATED COMPLICATIONS   Recurrent symptoms that may result in a chronic condition.  Problems of the lower back that are caused by compensating for the injury, such as limping.  Pain or weakness of the foot during push-off following surgery.  Chronic inflammation, scarring, and partial or complete fascia tear, occurring more often from repeated injections.  TREATMENT  Treatment initially involves the  use of ice and medication to help reduce pain and inflammation. The use of strengthening and stretching exercises may help reduce pain with activity, especially stretches of the Achilles tendon. These exercises may be performed at home or with a therapist. Your caregiver may recommend that you use heel cups of arch supports to help reduce stress on the plantar fascia. Occasionally, corticosteroid injections are given to reduce inflammation. If symptoms persist for greater than 6 months despite non-surgical (conservative), then surgery may be recommended.   MEDICATION   If pain medication is necessary, then nonsteroidal anti-inflammatory medications, such as aspirin and ibuprofen, or other minor pain relievers, such as acetaminophen, are often recommended.  Do not take pain medication within 7 days before surgery.  Prescription pain relievers may be given if deemed necessary by your caregiver. Use only as directed and only as much as you need.  Corticosteroid injections may be given by your caregiver. These injections should be reserved for the most serious cases, because they may only be given a certain number of times.  HEAT AND COLD  Cold treatment (icing) relieves pain and reduces inflammation. Cold treatment should be applied for 10 to 15 minutes every 2 to 3 hours for inflammation and pain and immediately after any activity that aggravates your symptoms. Use ice packs or massage the area with a piece of ice (ice massage).  Heat treatment may be used prior to performing the stretching and strengthening activities prescribed by your caregiver, physical therapist, or athletic trainer. Use a heat pack or soak the injury in warm water.  SEEK IMMEDIATE MEDICAL CARE IF:  Treatment seems to offer no benefit, or the condition worsens.  Any medications   produce adverse side effects.  EXERCISES- RANGE OF MOTION (ROM) AND STRETCHING EXERCISES - Plantar Fasciitis (Heel Spur Syndrome) These exercises  may help you when beginning to rehabilitate your injury. Your symptoms may resolve with or without further involvement from your physician, physical therapist or athletic trainer. While completing these exercises, remember:   Restoring tissue flexibility helps normal motion to return to the joints. This allows healthier, less painful movement and activity.  An effective stretch should be held for at least 30 seconds.  A stretch should never be painful. You should only feel a gentle lengthening or release in the stretched tissue.  RANGE OF MOTION - Toe Extension, Flexion  Sit with your right / left leg crossed over your opposite knee.  Grasp your toes and gently pull them back toward the top of your foot. You should feel a stretch on the bottom of your toes and/or foot.  Hold this stretch for 10 seconds.  Now, gently pull your toes toward the bottom of your foot. You should feel a stretch on the top of your toes and or foot.  Hold this stretch for 10 seconds. Repeat  times. Complete this stretch 3 times per day.   RANGE OF MOTION - Ankle Dorsiflexion, Active Assisted  Remove shoes and sit on a chair that is preferably not on a carpeted surface.  Place right / left foot under knee. Extend your opposite leg for support.  Keeping your heel down, slide your right / left foot back toward the chair until you feel a stretch at your ankle or calf. If you do not feel a stretch, slide your bottom forward to the edge of the chair, while still keeping your heel down.  Hold this stretch for 10 seconds. Repeat 3 times. Complete this stretch 2 times per day.   STRETCH  Gastroc, Standing  Place hands on wall.  Extend right / left leg, keeping the front knee somewhat bent.  Slightly point your toes inward on your back foot.  Keeping your right / left heel on the floor and your knee straight, shift your weight toward the wall, not allowing your back to arch.  You should feel a gentle stretch  in the right / left calf. Hold this position for 10 seconds. Repeat 3 times. Complete this stretch 2 times per day.  STRETCH  Soleus, Standing  Place hands on wall.  Extend right / left leg, keeping the other knee somewhat bent.  Slightly point your toes inward on your back foot.  Keep your right / left heel on the floor, bend your back knee, and slightly shift your weight over the back leg so that you feel a gentle stretch deep in your back calf.  Hold this position for 10 seconds. Repeat 3 times. Complete this stretch 2 times per day.  STRETCH  Gastrocsoleus, Standing  Note: This exercise can place a lot of stress on your foot and ankle. Please complete this exercise only if specifically instructed by your caregiver.   Place the ball of your right / left foot on a step, keeping your other foot firmly on the same step.  Hold on to the wall or a rail for balance.  Slowly lift your other foot, allowing your body weight to press your heel down over the edge of the step.  You should feel a stretch in your right / left calf.  Hold this position for 10 seconds.  Repeat this exercise with a slight bend in your right /   left knee. Repeat 3 times. Complete this stretch 2 times per day.   STRENGTHENING EXERCISES - Plantar Fasciitis (Heel Spur Syndrome)  These exercises may help you when beginning to rehabilitate your injury. They may resolve your symptoms with or without further involvement from your physician, physical therapist or athletic trainer. While completing these exercises, remember:   Muscles can gain both the endurance and the strength needed for everyday activities through controlled exercises.  Complete these exercises as instructed by your physician, physical therapist or athletic trainer. Progress the resistance and repetitions only as guided.  STRENGTH - Towel Curls  Sit in a chair positioned on a non-carpeted surface.  Place your foot on a towel, keeping your heel  on the floor.  Pull the towel toward your heel by only curling your toes. Keep your heel on the floor. Repeat 3 times. Complete this exercise 2 times per day.  STRENGTH - Ankle Inversion  Secure one end of a rubber exercise band/tubing to a fixed object (table, pole). Loop the other end around your foot just before your toes.  Place your fists between your knees. This will focus your strengthening at your ankle.  Slowly, pull your big toe up and in, making sure the band/tubing is positioned to resist the entire motion.  Hold this position for 10 seconds.  Have your muscles resist the band/tubing as it slowly pulls your foot back to the starting position. Repeat 3 times. Complete this exercises 2 times per day.  Document Released: 07/05/2005 Document Revised: 09/27/2011 Document Reviewed: 10/17/2008 [employer] Patient Information 2014 [employer]. Achilles Tendinitis  with Rehab Achilles tendinitis is a disorder of the Achilles tendon. The Achilles tendon connects the large calf muscles (Gastrocnemius and Soleus) to the heel bone (calcaneus). This tendon is sometimes called the heel cord. It is important for pushing-off and standing on your toes and is important for walking, running, or jumping. Tendinitis is often caused by overuse and repetitive microtrauma. SYMPTOMS  Pain, tenderness, swelling, warmth, and redness may occur over the Achilles tendon even at rest.  Pain with pushing off, or flexing or extending the ankle.  Pain that is worsened after or during activity. CAUSES   Overuse sometimes seen with rapid increase in exercise programs or in sports requiring running and jumping.  Poor physical conditioning (strength and flexibility or endurance).  Running sports, especially training running down hills.  Inadequate warm-up before practice or play or failure to stretch before participation.  Injury to the tendon. PREVENTION   Warm up and stretch before practice or  competition.  Allow time for adequate rest and recovery between practices and competition.  Keep up conditioning.  Keep up ankle and leg flexibility.  Improve or keep muscle strength and endurance.  Improve cardiovascular fitness.  Use proper technique.  Use proper equipment (shoes, skates).  To help prevent recurrence, taping, protective strapping, or an adhesive bandage may be recommended for several weeks after healing is complete. PROGNOSIS   Recovery may take weeks to several months to heal.  Longer recovery is expected if symptoms have been prolonged.  Recovery is usually quicker if the inflammation is due to a direct blow as compared with overuse or sudden strain. RELATED COMPLICATIONS   Healing time will be prolonged if the condition is not correctly treated. The injury must be given plenty of time to heal.  Symptoms can reoccur if activity is resumed too soon.  Untreated, tendinitis may increase the risk of tendon rupture requiring additional time for recovery   and possibly surgery. TREATMENT   The first treatment consists of rest anti-inflammatory medication, and ice to relieve the pain.  Stretching and strengthening exercises after resolution of pain will likely help reduce the risk of recurrence. Referral to a physical therapist or athletic trainer for further evaluation and treatment may be helpful.  A walking boot or cast may be recommended to rest the Achilles tendon. This can help break the cycle of inflammation and microtrauma.  Arch supports (orthotics) may be prescribed or recommended by your caregiver as an adjunct to therapy and rest.  Surgery to remove the inflamed tendon lining or degenerated tendon tissue is rarely necessary and has shown less than predictable results. MEDICATION   Nonsteroidal anti-inflammatory medications, such as aspirin and ibuprofen, may be used for pain and inflammation relief. Do not take within 7 days before surgery. Take  these as directed by your caregiver. Contact your caregiver immediately if any bleeding, stomach upset, or signs of allergic reaction occur. Other minor pain relievers, such as acetaminophen, may also be used.  Pain relievers may be prescribed as necessary by your caregiver. Do not take prescription pain medication for longer than 4 to 7 days. Use only as directed and only as much as you need.  Cortisone injections are rarely indicated. Cortisone injections may weaken tendons and predispose to rupture. It is better to give the condition more time to heal than to use them. HEAT AND COLD  Cold is used to relieve pain and reduce inflammation for acute and chronic Achilles tendinitis. Cold should be applied for 10 to 15 minutes every 2 to 3 hours for inflammation and pain and immediately after any activity that aggravates your symptoms. Use ice packs or an ice massage.  Heat may be used before performing stretching and strengthening activities prescribed by your caregiver. Use a heat pack or a warm soak. SEEK MEDICAL CARE IF:  Symptoms get worse or do not improve in 2 weeks despite treatment.  New, unexplained symptoms develop. Drugs used in treatment may produce side effects.  EXERCISES:  RANGE OF MOTION (ROM) AND STRETCHING EXERCISES - Achilles Tendinitis  These exercises may help you when beginning to rehabilitate your injury. Your symptoms may resolve with or without further involvement from your physician, physical therapist or athletic trainer. While completing these exercises, remember:   Restoring tissue flexibility helps normal motion to return to the joints. This allows healthier, less painful movement and activity.  An effective stretch should be held for at least 30 seconds.  A stretch should never be painful. You should only feel a gentle lengthening or release in the stretched tissue.  STRETCH  Gastroc, Standing   Place hands on wall.  Extend right / left leg, keeping the  front knee somewhat bent.  Slightly point your toes inward on your back foot.  Keeping your right / left heel on the floor and your knee straight, shift your weight toward the wall, not allowing your back to arch.  You should feel a gentle stretch in the right / left calf. Hold this position for 10 seconds. Repeat 3 times. Complete this stretch 2 times per day.  STRETCH  Soleus, Standing   Place hands on wall.  Extend right / left leg, keeping the other knee somewhat bent.  Slightly point your toes inward on your back foot.  Keep your right / left heel on the floor, bend your back knee, and slightly shift your weight over the back leg so that you feel a   gentle stretch deep in your back calf.  Hold this position for 10 seconds. Repeat 3 times. Complete this stretch 2 times per day.  STRETCH  Gastrocsoleus, Standing  Note: This exercise can place a lot of stress on your foot and ankle. Please complete this exercise only if specifically instructed by your caregiver.   Place the ball of your right / left foot on a step, keeping your other foot firmly on the same step.  Hold on to the wall or a rail for balance.  Slowly lift your other foot, allowing your body weight to press your heel down over the edge of the step.  You should feel a stretch in your right / left calf.  Hold this position for 10 seconds.  Repeat this exercise with a slight bend in your knee. Repeat 3 times. Complete this stretch 2 times per day.   STRENGTHENING EXERCISES - Achilles Tendinitis These exercises may help you when beginning to rehabilitate your injury. They may resolve your symptoms with or without further involvement from your physician, physical therapist or athletic trainer. While completing these exercises, remember:   Muscles can gain both the endurance and the strength needed for everyday activities through controlled exercises.  Complete these exercises as instructed by your physician,  physical therapist or athletic trainer. Progress the resistance and repetitions only as guided.  You may experience muscle soreness or fatigue, but the pain or discomfort you are trying to eliminate should never worsen during these exercises. If this pain does worsen, stop and make certain you are following the directions exactly. If the pain is still present after adjustments, discontinue the exercise until you can discuss the trouble with your clinician.  STRENGTH - Plantar-flexors   Sit with your right / left leg extended. Holding onto both ends of a rubber exercise band/tubing, loop it around the ball of your foot. Keep a slight tension in the band.  Slowly push your toes away from you, pointing them downward.  Hold this position for 10 seconds. Return slowly, controlling the tension in the band/tubing. Repeat 3 times. Complete this exercise 2 times per day.   STRENGTH - Plantar-flexors   Stand with your feet shoulder width apart. Steady yourself with a wall or table using as little support as needed.  Keeping your weight evenly spread over the width of your feet, rise up on your toes.*  Hold this position for 10 seconds. Repeat 3 times. Complete this exercise 2 times per day.  *If this is too easy, shift your weight toward your right / left leg until you feel challenged. Ultimately, you may be asked to do this exercise with your right / left foot only.  STRENGTH  Plantar-flexors, Eccentric  Note: This exercise can place a lot of stress on your foot and ankle. Please complete this exercise only if specifically instructed by your caregiver.   Place the balls of your feet on a step. With your hands, use only enough support from a wall or rail to keep your balance.  Keep your knees straight and rise up on your toes.  Slowly shift your weight entirely to your right / left toes and pick up your opposite foot. Gently and with controlled movement, lower your weight through your right /  left foot so that your heel drops below the level of the step. You will feel a slight stretch in the back of your calf at the end position.  Use the healthy leg to help rise up onto   the balls of both feet, then lower weight only on the right / left leg again. Build up to 15 repetitions. Then progress to 3 consecutive sets of 15 repetitions.*  After completing the above exercise, complete the same exercise with a slight knee bend (about 30 degrees). Again, build up to 15 repetitions. Then progress to 3 consecutive sets of 15 repetitions.* Perform this exercise 2 times per day.  *When you easily complete 3 sets of 15, your physician, physical therapist or athletic trainer may advise you to add resistance by wearing a backpack filled with additional weight.  STRENGTH - Plantar Flexors, Seated   Sit on a chair that allows your feet to rest flat on the ground. If necessary, sit at the edge of the chair.  Keeping your toes firmly on the ground, lift your right / left heel as far as you can without increasing any discomfort in your ankle. Repeat 3 times. Complete this exercise 2 times a day.  

## 2017-08-26 ENCOUNTER — Telehealth: Payer: Self-pay | Admitting: Podiatry

## 2017-08-26 NOTE — Telephone Encounter (Signed)
I saw Dr. Samuella CotaPrice on Tuesday and he prescribed me an antiinflammatory. I have been taking it like he told me. Unfortunately it has caused some issues with another problem I have, extreme pain in my upper extremities. I had discussed this with him previously and just kind of need to know what to do about this. Do I need to change prescriptions or is there something else I can do to change this. If you could please call me back at [phone]. Thank you and hope you have a great day.

## 2017-09-08 DIAGNOSIS — G44209 Tension-type headache, unspecified, not intractable: Secondary | ICD-10-CM | POA: Insufficient documentation

## 2017-09-08 DIAGNOSIS — R030 Elevated blood-pressure reading, without diagnosis of hypertension: Secondary | ICD-10-CM | POA: Insufficient documentation

## 2017-09-08 DIAGNOSIS — R7303 Prediabetes: Secondary | ICD-10-CM | POA: Insufficient documentation

## 2017-09-08 DIAGNOSIS — Z13220 Encounter for screening for lipoid disorders: Secondary | ICD-10-CM | POA: Insufficient documentation

## 2017-09-08 HISTORY — DX: Elevated blood-pressure reading, without diagnosis of hypertension: R03.0

## 2017-09-13 ENCOUNTER — Ambulatory Visit: Payer: 59 | Admitting: Podiatry

## 2017-09-13 DIAGNOSIS — M722 Plantar fascial fibromatosis: Secondary | ICD-10-CM | POA: Diagnosis not present

## 2017-09-13 NOTE — Progress Notes (Signed)
  Subjective:  Patient ID: Denise Black, female    DOB: 07-04-1966,  MRN: [id number]  Chief Complaint  Patient presents with  . Plantar Fasciitis    3 wk fu PF still painful only got the slightest bit of relief    52 y.o. female returns for the above complaint. States the injection wore off after a few days. Believes the pain went away shortly after the injection.States 2 weeks post-injection she experienced increase in her BP and is wondering if this could be from the injection.  Objective:  There were no vitals filed for this visit. General AA&O x3. Normal mood and affect.  Vascular Pedal pulses palpable.  Neurologic Epicritic sensation grossly intact.  Dermatologic No open lesions. Skin normal texture and turgor.  Orthopedic: Pain to palpation L medial calcaneal tuber.   Assessment & Plan:  Patient was evaluated and treated and all questions answered.  Plantar Fasciitis, L -Repeat injection as below. -Continue night splint and stretching exercises.  Procedure: Injection Tendon/Ligament Consent: Verbal consent obrained. Location: Left plantar fascia at the glabrous junction; medial approach. Skin Prep: Alcohol. Injectate: 1 cc 0.5% marcaine plain, 1 cc Celestone. Disposition: Patient tolerated procedure well. Injection site dressed with a band-aid.     No Follow-up on file.

## 2017-09-15 ENCOUNTER — Other Ambulatory Visit: Payer: Self-pay | Admitting: Podiatry

## 2017-09-15 DIAGNOSIS — M722 Plantar fascial fibromatosis: Secondary | ICD-10-CM

## 2017-09-15 DIAGNOSIS — M7662 Achilles tendinitis, left leg: Secondary | ICD-10-CM

## 2017-09-15 DIAGNOSIS — M79672 Pain in left foot: Secondary | ICD-10-CM

## 2017-09-15 DIAGNOSIS — M659 Synovitis and tenosynovitis, unspecified: Secondary | ICD-10-CM

## 2017-10-04 ENCOUNTER — Ambulatory Visit: Payer: 59 | Admitting: Podiatry

## 2017-10-04 DIAGNOSIS — M792 Neuralgia and neuritis, unspecified: Secondary | ICD-10-CM | POA: Diagnosis not present

## 2017-10-04 DIAGNOSIS — M722 Plantar fascial fibromatosis: Secondary | ICD-10-CM

## 2017-10-04 DIAGNOSIS — L309 Dermatitis, unspecified: Secondary | ICD-10-CM | POA: Diagnosis not present

## 2017-10-04 MED ORDER — BETAMETHASONE DIPROPIONATE 0.05 % EX CREA: TOPICAL_CREAM | Freq: Two times a day (BID) | CUTANEOUS | 0 refills | Status: AC

## 2017-10-04 NOTE — Progress Notes (Signed)
  Subjective:  Patient ID: Denise JacksonLaura J Black, female    DOB: [birth year],  MRN: [id number]  Chief Complaint  Patient presents with  . Plantar Fasciitis    F/U Lt fasciitis Pt. stated," when I'm on my feet a lot my feet starts hurting and swelling." -shot did not helped much   52 y.o. female returns for the above complaint. States the injection wore off after a few days. Believes the pain went away shortly after the injection.States 2 weeks post-injection she experienced increase in her BP and is wondering if this could be from the injection.  Objective:  There were no vitals filed for this visit. General AA&O x3. Normal mood and affect.  Vascular Pedal pulses palpable.  Neurologic Epicritic sensation grossly intact.  Dermatologic Xerosis with dermatitis noted in moccasin distribution plantar foot bilat. Skin normal texture and turgor.  Orthopedic: Pain to palpation L medial calcaneal tuber, pain about medial calcaneus about the area of baxter's nerve   Assessment & Plan:  Patient was evaluated and treated and all questions answered.  Dermatitis -Rx Betamethasone. Advised on application. To be used for no more than 2 weeks.  Plantar Fasciitis, left - Injection consisting of a mixture of 1 cc half percent Marcaine plain, 1 cc dexamethasone phosphate, 0.5 cc Celestone injected into both the medial calcaneal tuber via medial approach and the area of the Baxter's nerve. Approximately 2 cc was used at the calcaneal insertion with the remainder [facility]. Verbal consent obtained, skin prep performed with alcohol. - Continue night splint.   Return in about 3 weeks (around 10/25/2017) for Plantar Fasciitis L.

## 2017-10-24 ENCOUNTER — Ambulatory Visit: Payer: 59 | Admitting: Podiatry

## 2018-06-28 DIAGNOSIS — R600 Localized edema: Secondary | ICD-10-CM | POA: Insufficient documentation

## 2018-06-28 DIAGNOSIS — L309 Dermatitis, unspecified: Secondary | ICD-10-CM | POA: Insufficient documentation

## 2018-06-28 HISTORY — DX: Localized edema: R60.0

## 2018-09-01 DIAGNOSIS — R42 Dizziness and giddiness: Secondary | ICD-10-CM | POA: Insufficient documentation

## 2018-09-01 DIAGNOSIS — H6121 Impacted cerumen, right ear: Secondary | ICD-10-CM | POA: Insufficient documentation

## 2018-09-01 HISTORY — DX: Dizziness and giddiness: R42

## 2021-05-20 DIAGNOSIS — I83819 Varicose veins of unspecified lower extremities with pain: Secondary | ICD-10-CM

## 2021-05-20 HISTORY — DX: Varicose veins of unspecified lower extremity with pain: I83.819

## 2022-03-04 ENCOUNTER — Encounter: Payer: Self-pay | Admitting: *Deleted

## 2022-03-04 ENCOUNTER — Encounter: Payer: Self-pay | Admitting: Cardiology

## 2022-03-30 NOTE — Progress Notes (Unsigned)
Cardiology Office Note:    Date:  03/31/2022   ID:  Denise Black, DOB February 07, 1966, MRN [id number]  PCP:  Maris Berger, MD  Cardiologist:  Norman Herrlich, MD   Referring MD: Maris Berger, MD  ASSESSMENT:    1. Palpitations   2. Precordial pain    PLAN:    In order of problems listed above:  Her clinical picture is quite suggestive of paroxysmal atrial fibrillation but uncommon with the associated anginal chest discomfort.  We will to go ahead and do an event monitor but use the live version to capture episodes and I think we will also do a ischemia evaluation with cardiac CTA and crease cardiovascular risk with her inflammatory arthritis. Dye allergy her symptoms as hypertension not having angioedema we will give her a steroid prep.  Next appointment 3 months   Medication Adjustments/Labs and Tests Ordered: Current medicines are reviewed at length with the patient today.  Concerns regarding medicines are outlined above.  Orders Placed This Encounter  Procedures   CT CORONARY MORPH W/CTA COR W/SCORE W/CA W/CM &/OR WO/CM   Basic Metabolic Panel (BMET)   LONG TERM MONITOR-LIVE TELEMETRY (3-14 DAYS)   EKG 12-Lead   Meds ordered this encounter  Medications   predniSONE (DELTASONE) 50 MG tablet    Sig: 1. Prednisone 50 mg - take 13 hours prior to test   2. Take another Prednisone 50 mg 7 hours prior to test   3. Take another Prednisone 50 mg 1 hour prior to test    Dispense:  3 tablet    Refill:  0   metoprolol tartrate (LOPRESSOR) 100 MG tablet    Sig: Take 1 tablet (100 mg total) by mouth once for 1 dose. Please take 2 hours before CT    Dispense:  1 tablet    Refill:  0      Chief complaint: Episodes of erratic heart rate associated with chest tightness  History of Present Illness:    Denise Black is a 56 y.o. female who is being seen today for the evaluation of palpitation at the request of Sistasis, Rowena, MD.  She has a history of psoriatic arthritis and  neuropathy. Recent labs 07/16/2021: Hemoglobin 13.4 platelets 207,000 CRP is quite low at 2 CMP normal with creatinine 0.74 GFR 95 cc potassium 4.6 and normal liver function test  I have cared for her multiple family members including her husband who is deceased. Her world changed at the end of July beginning of August and she has had frequent episodes of her heart beating erratically she describes it as crazy associated at times with near syncope and chest tightness.  The episodes usually last shorter periods 5 to 10 minutes but have lasted for hours. Otherwise she has no exertional chest pain edema shortness of breath unfortunately has not lost consciousness. 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   betamethasone dipropionate (DIPROLENE) 0.05 % cream Apply topically 2 (two) times daily.   esomeprazole (NEXIUM) 20 MG capsule Take 20 mg by mouth daily at 12 noon.   folic  acid (FOLVITE) 1 MG tablet Take 3 tablets by mouth daily.   metoprolol tartrate (LOPRESSOR) 100 MG tablet Take 1 tablet (100 mg total) by mouth once for 1 dose. Please take 2 hours before CT   OVER THE COUNTER MEDICATION Take 1 tablet by mouth daily. Hyland's Leg Cramp Tablet   predniSONE (DELTASONE) 50 MG tablet 1. Prednisone 50 mg - take 13 hours prior to test   2. Take another Prednisone 50 mg 7 hours prior to test   3. Take another Prednisone 50 mg 1 hour prior to test   solifenacin (VESICARE) 10 MG tablet Take 10 mg by mouth daily.   traZODone (DESYREL) 100 MG tablet Take 100 mg by mouth at bedtime as needed for sleep.   [DISCONTINUED] diphenhydrAMINE (BENADRYL) 25 mg capsule Take 25 mg by mouth every 6 (six) hours.     Allergies:   Iodinated contrast media, Iohexol, and Penicillins   Social History   Socioeconomic History   Marital status: Married    Spouse name: Not on file   Number of children: Not on file   Years of education: Not on file   Highest education level: Not on file  Occupational History   Not on file  Tobacco Use   Smoking status: Former    Passive exposure: Past   Smokeless tobacco: Never  Vaping Use   Vaping Use: Never used  Substance and Sexual Activity   Alcohol use: Yes    Comment: Occasional   Drug use: Never   Sexual activity: Not on file  Other Topics Concern   Not on file  Social History Narrative   Not on file   Social Determinants of Health   Financial Resource Strain: Not on file  Food Insecurity: Not on file  Transportation Needs: Not on file  Physical Activity: Not on file  Stress: Not on file  Social Connections: Not on file     Family History: The patient's family history includes Alzheimer's disease in her mother; Osteoporosis in her mother.  ROS:   ROS Please see the history of present illness.     All other systems reviewed and are negative.  EKGs/Labs/Other Studies Reviewed:    The following studies were reviewed  today:   EKG:  EKG is  ordered today.  The ekg ordered today is personally reviewed and demonstrates sinus rhythm normal    Physical Exam:    VS:  BP 128/82 (BP Location: Right Arm, Patient Position: Sitting)   Pulse 80   Ht 5\' 2"  (1.575 m)   Wt 183 lb 12.8 oz (83.4 kg)   SpO2 97%   BMI 33.62 kg/m     Wt Readings from Last 3 Encounters:  03/31/22 183 lb 12.8 oz (83.4 kg)  01/04/22 179 lb (81.2 kg)  08/23/17 210  lb (95.3 kg)     GEN:  Well nourished, well developed in no acute distress HEENT: Normal NECK: No JVD; No carotid bruits LYMPHATICS: No lymphadenopathy CARDIAC: RRR, no murmurs, rubs, gallops RESPIRATORY:  Clear to auscultation without rales, wheezing or rhonchi  ABDOMEN: Soft, non-tender, non-distended MUSCULOSKELETAL:  No edema; No deformity  SKIN: Warm and dry NEUROLOGIC:  Alert and oriented x 3 PSYCHIATRIC:  Normal affect     Signed, Shirlee More, MD  03/31/2022 3:13 PM    [facility]

## 2022-03-31 ENCOUNTER — Ambulatory Visit: Payer: Commercial Managed Care - HMO | Attending: Cardiology | Admitting: Cardiology

## 2022-03-31 ENCOUNTER — Encounter: Payer: Self-pay | Admitting: Cardiology

## 2022-03-31 ENCOUNTER — Ambulatory Visit: Payer: Commercial Managed Care - HMO | Attending: Cardiology

## 2022-03-31 VITALS — BP 128/82 | HR 80 | Ht 62.0 in | Wt 183.8 lb

## 2022-03-31 DIAGNOSIS — R002 Palpitations: Secondary | ICD-10-CM

## 2022-03-31 DIAGNOSIS — R072 Precordial pain: Secondary | ICD-10-CM | POA: Diagnosis not present

## 2022-03-31 MED ORDER — PREDNISONE 50 MG PO TABS: ORAL_TABLET | ORAL | 0 refills | Status: AC

## 2022-03-31 MED ORDER — METOPROLOL TARTRATE 100 MG PO TABS: 100.0000 mg | ORAL_TABLET | Freq: Once | ORAL | 0 refills | Status: AC

## 2022-03-31 NOTE — Patient Instructions (Signed)
Medication Instructions:  Your physician has recommended you make the following change in your medication:   STOP: Benadryl  *If you need a refill on your cardiac medications before your next appointment, please call your pharmacy*   Lab Work: Your physician recommends that you return for lab work in:   Labs 1 week before CT: BMP  If you have labs (blood work) drawn today and your tests are completely normal, you will receive your results only by: MyChart Message (if you have MyChart) OR A paper copy in the mail If you have any lab test that is abnormal or we need to change your treatment, we will call you to review the results.   Testing/Procedures:   Your cardiac CT will be scheduled at one of the below locations:   [facility] [address] [place], [place] [address] [phone]  OR  [facility] [address] [place], [place] [address] [phone]  OR   [facility] [address] [place], [place] [address] [phone]  If scheduled at [facility], please arrive at the [facility] Entrance (Entrance C2) of [facility] 30 minutes prior to test start time. You can use the FREE valet parking offered at entrance C (encouraged to control the heart rate for the test)  Proceed to the [facility] Radiology Department (first floor) to check-in and test prep.  All radiology patients and guests should use entrance C2 at [facility], accessed from [facility], even though the hospital's physical address listed is [address].    If scheduled at [facility] or [facility], please arrive 15 mins early for check-in and test prep.   Please follow these instructions carefully (unless otherwise directed):  On the Night Before the Test: Be sure to Drink plenty of water. Do not consume  any caffeinated/decaffeinated beverages or chocolate 12 hours prior to your test. Do not take any antihistamines 12 hours prior to your test. If the patient has contrast allergy: Patient will need a prescription for Prednisone and very clear instructions (as follows): Prednisone 50 mg - take 13 hours prior to test Take another Prednisone 50 mg 7 hours prior to test Take another Prednisone 50 mg 1 hour prior to test Take Benadryl 50 mg 1 hour prior to test Patient must complete all four doses of above prophylactic medications. Patient will need a ride after test due to Benadryl.  On the Day of the Test: Drink plenty of water until 1 hour prior to the test. You may take your regular medications prior to the test.  Take metoprolol (Lopressor) two hours prior to test. FEMALES- please wear underwire-free bra if available, avoid dresses & tight clothing      After the Test: Drink plenty of water. After receiving IV contrast, you may experience a mild flushed feeling. This is normal. On occasion, you may experience a mild rash up to 24 hours after the test. This is not dangerous. If this occurs, you can take Benadryl 25 mg and increase your fluid intake. If you experience trouble breathing, this can be serious. If it is severe call 911 IMMEDIATELY. If it is mild, please call our office.  We will call to schedule your test 2-4 weeks out understanding that some insurance companies will need an authorization prior to the service being performed.   For non-scheduling related questions, please contact the cardiac imaging  nurse navigator should you have any questions/concerns: Rockwell Alexandria, Cardiac Imaging Nurse Navigator Larey Brick, Cardiac Imaging Nurse Navigator [facility] Direct Office Dial: [phone]   For scheduling needs, including cancellations and rescheduling, please call [place], [phone].    Follow-Up: At [facility], you and your  health needs are our priority.  As part of our continuing mission to provide you with exceptional heart care, we have created designated Provider Care Teams.  These Care Teams include your primary Cardiologist (physician) and Advanced Practice Providers (APPs -  Physician Assistants and Nurse Practitioners) who all work together to provide you with the care you need, when you need it.  We recommend signing up for the patient portal called "MyChart".  Sign up information is provided on this After Visit Summary.  MyChart is used to connect with patients for Virtual Visits (Telemedicine).  Patients are able to view lab/test results, encounter notes, upcoming appointments, etc.  Non-urgent messages can be sent to your provider as well.   To learn more about what you can do with MyChart, go to [email or web address].    Your next appointment:   3 month(s)  The format for your next appointment:   In Person  Provider:   Norman Herrlich, MD    Other Instructions None  Important Information About Sugar

## 2022-04-01 ENCOUNTER — Encounter: Payer: Self-pay | Admitting: Cardiology

## 2022-04-07 ENCOUNTER — Telehealth: Payer: Self-pay | Admitting: Cardiology

## 2022-04-07 NOTE — Telephone Encounter (Signed)
Patient would like to know how long she needs to keep her monitor on. Please advise.

## 2022-04-07 NOTE — Telephone Encounter (Signed)
How long is the patient supposed to wear the heart monitor?

## 2022-04-07 NOTE — Telephone Encounter (Signed)
Recommendations reviewed with pt as per Dr. Munley's note.  Pt verbalized understanding and had no additional questions.  

## 2022-04-14 ENCOUNTER — Telehealth: Payer: Self-pay | Admitting: *Deleted

## 2022-04-14 NOTE — Telephone Encounter (Signed)
Received email from [facility] stating that pt has pressed the button the max number of times (100) and another monitor will have to be sent to pt to continue monitoring.

## 2022-04-15 ENCOUNTER — Telehealth: Payer: Self-pay | Admitting: Cardiology

## 2022-04-15 NOTE — Telephone Encounter (Signed)
[place] with [employer] states their provider, Dr. Clair Gulling, is requesting last office visit notes plus any labs.  Phone#: [phone] Fax#: [phone]

## 2022-04-15 NOTE — Telephone Encounter (Signed)
Request faxed

## 2022-04-20 ENCOUNTER — Telehealth (HOSPITAL_COMMUNITY): Payer: Self-pay | Admitting: *Deleted

## 2022-04-20 NOTE — Telephone Encounter (Signed)
Reaching out to patient to offer assistance regarding upcoming cardiac imaging study; pt verbalizes understanding of appt date/time, parking situation and where to check in, medications ordered, and verified current allergies; name and call back number provided for further questions should they arise  Denise Clement RN Navigator Cardiac Imaging [facility] [phone] office [phone] cell  Reviewed how to take 13 hour prep and metoprolol for her cardiac CT scan. She verbalized understanding. She is a aware to arrive at 9:30am.

## 2022-04-21 ENCOUNTER — Ambulatory Visit (HOSPITAL_COMMUNITY)
Admission: RE | Admit: 2022-04-21 | Discharge: 2022-04-21 | Disposition: A | Discharge status: Home / Self Care | Payer: Commercial Managed Care - HMO | Source: Ambulatory Visit | Attending: Cardiology | Admitting: Cardiology

## 2022-04-21 DIAGNOSIS — R072 Precordial pain: Secondary | ICD-10-CM

## 2022-04-21 MED ORDER — NITROGLYCERIN 0.4 MG SL SUBL
SUBLINGUAL_TABLET | SUBLINGUAL | Status: AC
  Filled 2022-04-21: qty 2

## 2022-04-21 MED ORDER — IOHEXOL 350 MG/ML SOLN
100.0000 mL | Freq: Once | INTRAVENOUS | Status: AC | PRN
  Administered 2022-04-21: 95 mL via INTRAVENOUS

## 2022-04-21 MED ORDER — NITROGLYCERIN 0.4 MG SL SUBL: 0.8000 mg | SUBLINGUAL_TABLET | Freq: Once | SUBLINGUAL | Status: DC

## 2022-05-04 ENCOUNTER — Telehealth: Payer: Self-pay

## 2022-05-04 NOTE — Telephone Encounter (Signed)
Patient notified of results and said she is still experiencing elevated HR and severe fatigue. She want to know if there is other test/ tx or if she need to follow up to talk on what is next. Message sent to Dr . Bettina Gavia

## 2022-05-04 NOTE — Telephone Encounter (Signed)
-----   Message from Richardo Priest, MD sent at 04/30/2022  6:34 PM EDT ----- Normal or stable result

## 2022-05-06 ENCOUNTER — Telehealth: Payer: Self-pay

## 2022-05-06 NOTE — Telephone Encounter (Signed)
Spoke with the patient regarding the following per Dr. Bettina Gavia and I sent her records to PCP per her request

## 2022-05-06 NOTE — Telephone Encounter (Signed)
View All Conversations on this Encounter [place], Hilton Cork, MD  You 2 days ago    From using the monitor does not appear as if she has arrhythmia.   I think she would do best to go back and see her primary care physician   She also may want to get a smart watch to see if she can document these episodes.     Spoke with the patient advised of the following, records sent to PCP per her request.

## 2022-07-06 ENCOUNTER — Ambulatory Visit: Payer: Commercial Managed Care - HMO | Admitting: Cardiology
